# Patient Record
Sex: Male | Born: 1990 | Race: White | Hispanic: No | Marital: Single
Health system: Southern US, Community
[De-identification: ages and names within clinical notes are randomized; demographics above are authoritative.]

## PROBLEM LIST (undated history)

## (undated) ENCOUNTER — Emergency Department (HOSPITAL_COMMUNITY): Discharge status: Against Medical Advice | Payer: Self-pay

---
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
## 2014-04-17 ENCOUNTER — Ambulatory Visit (INDEPENDENT_AMBULATORY_CARE_PROVIDER_SITE_OTHER): Payer: Self-pay | Admitting: Family Medicine

## 2014-04-17 DIAGNOSIS — A749 Chlamydial infection, unspecified: Secondary | ICD-10-CM

## 2014-04-17 NOTE — Progress Notes (Signed)
   Subjective:    Patient ID: Jeffrey Smith, male    DOB: [birth year], 24 y.o.   MRN: [id number] Pt came in today for an azithromycin slurry.  Girlfriend saw Jeffrey Smith yesterday and was treated here in the office.  I stayed with pt and watched him drink all of the slurry.  Donne AnonAmber Alazae Crymes, CMA HPI    Review of Systems     Objective:   Physical Exam        Assessment & Plan:

## 2014-06-09 ENCOUNTER — Ambulatory Visit: Payer: Self-pay

## 2018-09-14 ENCOUNTER — Telehealth: Payer: Self-pay

## 2018-09-14 NOTE — Telephone Encounter (Signed)
Patient called to schedule covid testing, he says he already had a test done.   Copied from [place] [phone]. Topic: Quick Communication - See Telephone Encounter >> Sep 10, 2018 10:32 AM Loma [place] wrote: CRM for notification. See Telephone encounter for: 09/10/18. PT needs to be called bacjk ASAP was around positive covid this weekend. Needs to be tested asap please call at [phone]
